# Patient Record
Sex: Male | Born: 1950 | Race: White | Hispanic: No | Marital: Single | Smoking: Current every day smoker
Health system: Southern US, Community
[De-identification: ages and names within clinical notes are randomized; demographics above are authoritative.]

## PROBLEM LIST (undated history)

## (undated) DIAGNOSIS — E039 Hypothyroidism, unspecified: Secondary | ICD-10-CM

## (undated) DIAGNOSIS — M549 Dorsalgia, unspecified: Secondary | ICD-10-CM

## (undated) DIAGNOSIS — G4730 Sleep apnea, unspecified: Secondary | ICD-10-CM

## (undated) DIAGNOSIS — G8929 Other chronic pain: Secondary | ICD-10-CM

## (undated) DIAGNOSIS — M109 Gout, unspecified: Secondary | ICD-10-CM

## (undated) HISTORY — PX: HERNIA REPAIR: SHX51

## (undated) HISTORY — PX: TONSILLECTOMY: SUR1361

---

## 2004-07-24 ENCOUNTER — Emergency Department: Payer: Self-pay | Admitting: Emergency Medicine

## 2005-01-14 ENCOUNTER — Ambulatory Visit: Payer: Self-pay | Admitting: Family Medicine

## 2005-03-31 ENCOUNTER — Ambulatory Visit: Payer: Self-pay | Admitting: Family Medicine

## 2006-10-26 ENCOUNTER — Ambulatory Visit: Payer: Self-pay | Admitting: General Practice

## 2007-02-27 ENCOUNTER — Emergency Department: Payer: Self-pay | Admitting: Emergency Medicine

## 2008-05-30 ENCOUNTER — Ambulatory Visit: Payer: Self-pay | Admitting: Nurse Practitioner

## 2008-08-25 ENCOUNTER — Ambulatory Visit: Payer: Self-pay | Admitting: Internal Medicine

## 2011-03-17 ENCOUNTER — Ambulatory Visit: Payer: Self-pay | Admitting: Internal Medicine

## 2011-08-19 ENCOUNTER — Ambulatory Visit: Payer: Self-pay | Admitting: Emergency Medicine

## 2011-08-19 LAB — BASIC METABOLIC PANEL
BUN: 14 mg/dL (ref 7–18)
Calcium, Total: 9.2 mg/dL (ref 8.5–10.1)
Chloride: 103 mmol/L (ref 98–107)
Co2: 30 mmol/L (ref 21–32)
Creatinine: 0.96 mg/dL (ref 0.60–1.30)
EGFR (African American): >60
Glucose: 117 mg/dL — ABNORMAL HIGH (ref 65–99)
Osmolality: 281 (ref 275–301)
Potassium: 4.2 mmol/L (ref 3.5–5.1)
Sodium: 140 mmol/L (ref 136–145)

## 2011-08-19 LAB — CBC WITH DIFFERENTIAL/PLATELET
Basophil %: 0.6 %
Eosinophil #: 0.5 10*3/uL (ref 0.0–0.7)
Eosinophil %: 5.8 %
HCT: 50.7 % (ref 40.0–52.0)
HGB: 17 g/dL (ref 13.0–18.0)
Lymphocyte #: 1.9 10*3/uL (ref 1.0–3.6)
MCH: 31.8 pg (ref 26.0–34.0)
MCHC: 33.6 g/dL (ref 32.0–36.0)
Monocyte #: 0.7 x10 3/mm (ref 0.2–1.0)
Platelet: 294 10*3/uL (ref 150–440)
RBC: 5.36 10*6/uL (ref 4.40–5.90)
RDW: 14.3 % (ref 11.5–14.5)

## 2011-08-26 ENCOUNTER — Ambulatory Visit: Payer: Self-pay | Admitting: Emergency Medicine

## 2014-07-02 NOTE — Op Note (Signed)
PATIENT NAME:  Jacob Parsons, Jacob Parsons MR#:  [id number] DATE OF BIRTH:  [birth year]  DATE OF PROCEDURE:  08/26/2011  PREOPERATIVE DIAGNOSIS: Incarcerated ventral hernia.   POSTOPERATIVE DIAGNOSIS: Incarcerated ventral hernia.   PROCEDURES:   1. Repair of incarcerated ventral hernia.  2. Insertion of mesh.   SURGEON: Heba Ige S. Shaden Higley, MD  INDICATION: This patient who is very obese, also he has bad chronic obstructive pulmonary disease. He has been coughing a lot and he also is a smoker, has a hernia which is for a while, getting worse and he has been getting obstructed. Last time when I saw in the office it was incarcerated. Reduced it and there was still red skin on top of it so advised him for surgery.   DESCRIPTION OF PROCEDURE: Patient was then brought to surgery although a bad surgical risk. He was put to sleep. Small incision was made transversely under the hernia. After cutting skin and subcutaneous tissue, the fascia was then reached. The hernia was dissected all around. Fascia was then picked up with the Allis clamps. The hernia sac was dissected under the fascia all around quite deep until all the adhesions were broken down the mesh was put in underneath and then circular mesh was then put in and was sutured with 0 Surgilon sutures interruptedly. Subcuticular closure with 3-0 Vicryl and 4-0 Vicryl was done and Steri-Strips were applied. Patient tolerated procedure well. Sent to recovery room in satisfactory condition.   ____________________________ Alton RevereMasud S. Cecelia ByarsHashmi, MD msh:cms D: 08/26/2011 13:01:28 ET T: 08/26/2011 14:11:00 ET JOB#: [id number]  cc: Aryahna Spagna S. Cecelia ByarsHashmi, MD, <Dictator> Serita ShellerErnest B. Maryellen PileEason, MD Meryle ReadyMASUD S Daking Westervelt MD ELECTRONICALLY SIGNED 08/26/2011 15:40

## 2014-08-08 ENCOUNTER — Emergency Department: Payer: Medicare HMO

## 2014-08-08 ENCOUNTER — Emergency Department
Admission: EM | Admit: 2014-08-08 | Discharge: 2014-08-08 | Disposition: A | Discharge status: Home / Self Care | Payer: Medicare HMO | Attending: Emergency Medicine | Admitting: Emergency Medicine

## 2014-08-08 DIAGNOSIS — Z79899 Other long term (current) drug therapy: Secondary | ICD-10-CM | POA: Insufficient documentation

## 2014-08-08 DIAGNOSIS — Z720 Tobacco use: Secondary | ICD-10-CM | POA: Insufficient documentation

## 2014-08-08 DIAGNOSIS — M10072 Idiopathic gout, left ankle and foot: Secondary | ICD-10-CM | POA: Diagnosis not present

## 2014-08-08 DIAGNOSIS — R2242 Localized swelling, mass and lump, left lower limb: Secondary | ICD-10-CM | POA: Diagnosis present

## 2014-08-08 HISTORY — DX: Gout, unspecified: M10.9

## 2014-08-08 HISTORY — DX: Dorsalgia, unspecified: M54.9

## 2014-08-08 HISTORY — DX: Other chronic pain: G89.29

## 2014-08-08 HISTORY — DX: Sleep apnea, unspecified: G47.30

## 2014-08-08 HISTORY — DX: Hypothyroidism, unspecified: E03.9

## 2014-08-08 MED ORDER — KETOROLAC TROMETHAMINE 30 MG/ML IJ SOLN: 60.0000 mg | Freq: Once | INTRAMUSCULAR | Status: AC

## 2014-08-08 MED ORDER — KETOROLAC TROMETHAMINE 60 MG/2ML IM SOLN
INTRAMUSCULAR | Status: AC
  Administered 2014-08-08: 60 mg
  Filled 2014-08-08: qty 2

## 2014-08-08 MED ORDER — HYDROCODONE-ACETAMINOPHEN 5-325 MG PO TABS
2.0000 | ORAL_TABLET | Freq: Once | ORAL | Status: AC
  Administered 2014-08-08: 2 via ORAL

## 2014-08-08 MED ORDER — HYDROCODONE-ACETAMINOPHEN 5-325 MG PO TABS: 1.0000 | ORAL_TABLET | ORAL | Status: AC | PRN

## 2014-08-08 MED ORDER — IBUPROFEN 800 MG PO TABS: 800.0000 mg | ORAL_TABLET | Freq: Three times a day (TID) | ORAL | Status: AC | PRN

## 2014-08-08 MED ORDER — HYDROCODONE-ACETAMINOPHEN 5-325 MG PO TABS
ORAL_TABLET | ORAL | Status: AC
  Administered 2014-08-08: 2 via ORAL
  Filled 2014-08-08: qty 2

## 2014-08-08 MED ORDER — COLCHICINE 0.6 MG PO TABS: 1.2000 mg | ORAL_TABLET | Freq: Once | ORAL | Status: AC

## 2014-08-08 NOTE — Discharge Instructions (Signed)

## 2014-08-08 NOTE — ED Provider Notes (Signed)
[facility] Emergency Department Provider Note  ____________________________________________  Time seen: Approximately 2:18 PM  I have reviewed the triage vital signs and the nursing notes.   HISTORY  Chief Complaint Leg Swelling    HPI Jacob Parsons is a 64 y.o. male presents with complaints of left foot swelling and pain 4 days. Patient states history of the same with history of gout. Does not have any medications.   Past Medical History  Diagnosis Date  . Gout   . Sleep apnea   . Hypothyroid   . Chronic back pain     There are no active problems to display for this patient.   Past Surgical History  Procedure Laterality Date  . Tonsillectomy    . Hernia repair      Current Outpatient Rx  Name  Route  Sig  Dispense  Refill  . colchicine 0.6 MG tablet   Oral   Take 2 tablets (1.2 mg total) by mouth once. Wait 1 hour and take the 3rd tablet   3 tablet   0   . HYDROcodone-acetaminophen (NORCO) 5-325 MG per tablet   Oral   Take 1-2 tablets by mouth every 4 (four) hours as needed for moderate pain.   15 tablet   0   . ibuprofen (ADVIL,MOTRIN) 800 MG tablet   Oral   Take 1 tablet (800 mg total) by mouth every 8 (eight) hours as needed.   30 tablet   0     Allergies Shellfish allergy  No family history on file.  Social History History  Substance Use Topics  . Smoking status: Current Every Day Smoker -- 1.00 packs/day    Types: Cigarettes  . Smokeless tobacco: Never Used  . Alcohol Use: Yes    Review of Systems Constitutional: No fever/chills Eyes: No visual changes. ENT: No sore throat. Cardiovascular: Denies chest pain. Respiratory: Denies shortness of breath. Gastrointestinal: No abdominal pain.  No nausea, no vomiting.  No diarrhea.  No constipation. Genitourinary: Negative for dysuria. Musculoskeletal: Negative for back pain. Positive for left foot and leg pain. Skin: Negative for rash. Neurological: Negative  for headaches, focal weakness or numbness.  10-point ROS otherwise negative.  ____________________________________________   PHYSICAL EXAM:  VITAL SIGNS: ED Triage Vitals  Enc Vitals Group     BP 08/08/14 1243 142/92 mmHg     Pulse Rate 08/08/14 1243 95     Resp 08/08/14 1243 18     Temp 08/08/14 1243 98 F (36.7 C)     Temp Source 08/08/14 1243 Oral     SpO2 08/08/14 1243 89 %     Weight 08/08/14 1243 330 lb (149.687 kg)     Height 08/08/14 1243  (1.727 m)     Head Cir --      Peak Flow --      Pain Score 08/08/14 1256 10     Pain Loc --      Pain Edu? --      Excl. in GC? --     Constitutional: Alert and oriented. Well appearing and in no acute distress. Eyes: Conjunctivae are normal. PERRL. EOMI. Head: Atraumatic. Nose: No congestion/rhinnorhea. Mouth/Throat: Mucous membranes are moist.  Oropharynx non-erythematous. Neck: No stridor.   Cardiovascular: Normal rate, regular rhythm. Grossly normal heart sounds.  Good peripheral circulation. Respiratory: Normal respiratory effort.  No retractions. Lungs CTAB. Gastrointestinal: Soft and nontender. No distention. No abdominal bruits. No CVA tenderness. Musculoskeletal: No lower extremity tenderness nor edema.  No joint effusions. Neurologic:  Normal speech and language. No gross focal neurologic deficits are appreciated. Speech is normal. No gait instability. Skin:  Skin is warm, dry and intact. No rash noted. Psychiatric: Mood and affect are normal. Speech and behavior are normal.  ____________________________________________   LABS (all labs ordered are listed, but only abnormal results are displayed)  Labs Reviewed - No data to display ____________________________________________  EKG  None  ____________________________________________  RADIOLOGY  Negative ____________________________________________   PROCEDURES  Procedure(s) performed: None  Critical Care performed:  No  ____________________________________________   INITIAL IMPRESSION / ASSESSMENT AND PLAN / ED COURSE  Pertinent labs & imaging results that were available during my care of the patient were reviewed by me and considered in my medical decision making (see chart for details).  Patient was treated for gout. Rx given for colchicine and hydrocodone ibuprofen. He will follow up as needed. No other emergency medical complaints at this time. ____________________________________________   FINAL CLINICAL IMPRESSION(S) / ED DIAGNOSES  Final diagnoses:  Acute idiopathic gout of left foot      Evangeline DakinCharles M Jayan Raymundo, PA-C 08/08/14 1631  Jene Everyobert Kinner, MD 08/12/14 1539

## 2014-08-08 NOTE — ED Notes (Signed)
Pt c/o pain and swelling to the left foot since Thursday, states he has a hx of gout and this is the same..Marland Kitchen

## 2014-08-14 ENCOUNTER — Telehealth: Payer: Self-pay | Admitting: Family Medicine

## 2015-06-09 DEATH — deceased

## 2015-10-12 NOTE — Telephone Encounter (Signed)
error 

## 2016-08-11 IMAGING — CR DG FOOT COMPLETE 3+V*L*
1 series · 3 of 3 positions shown · non-contrast
Comparison: 02/27/2007

CLINICAL DATA: LEFT foot pain and swelling, history gout

EXAM:
LEFT FOOT - COMPLETE 3+ VIEW

[Series 1: x foot ap left · 0.14mm/px · 3 of 3 slices shown]
[im 1/3]
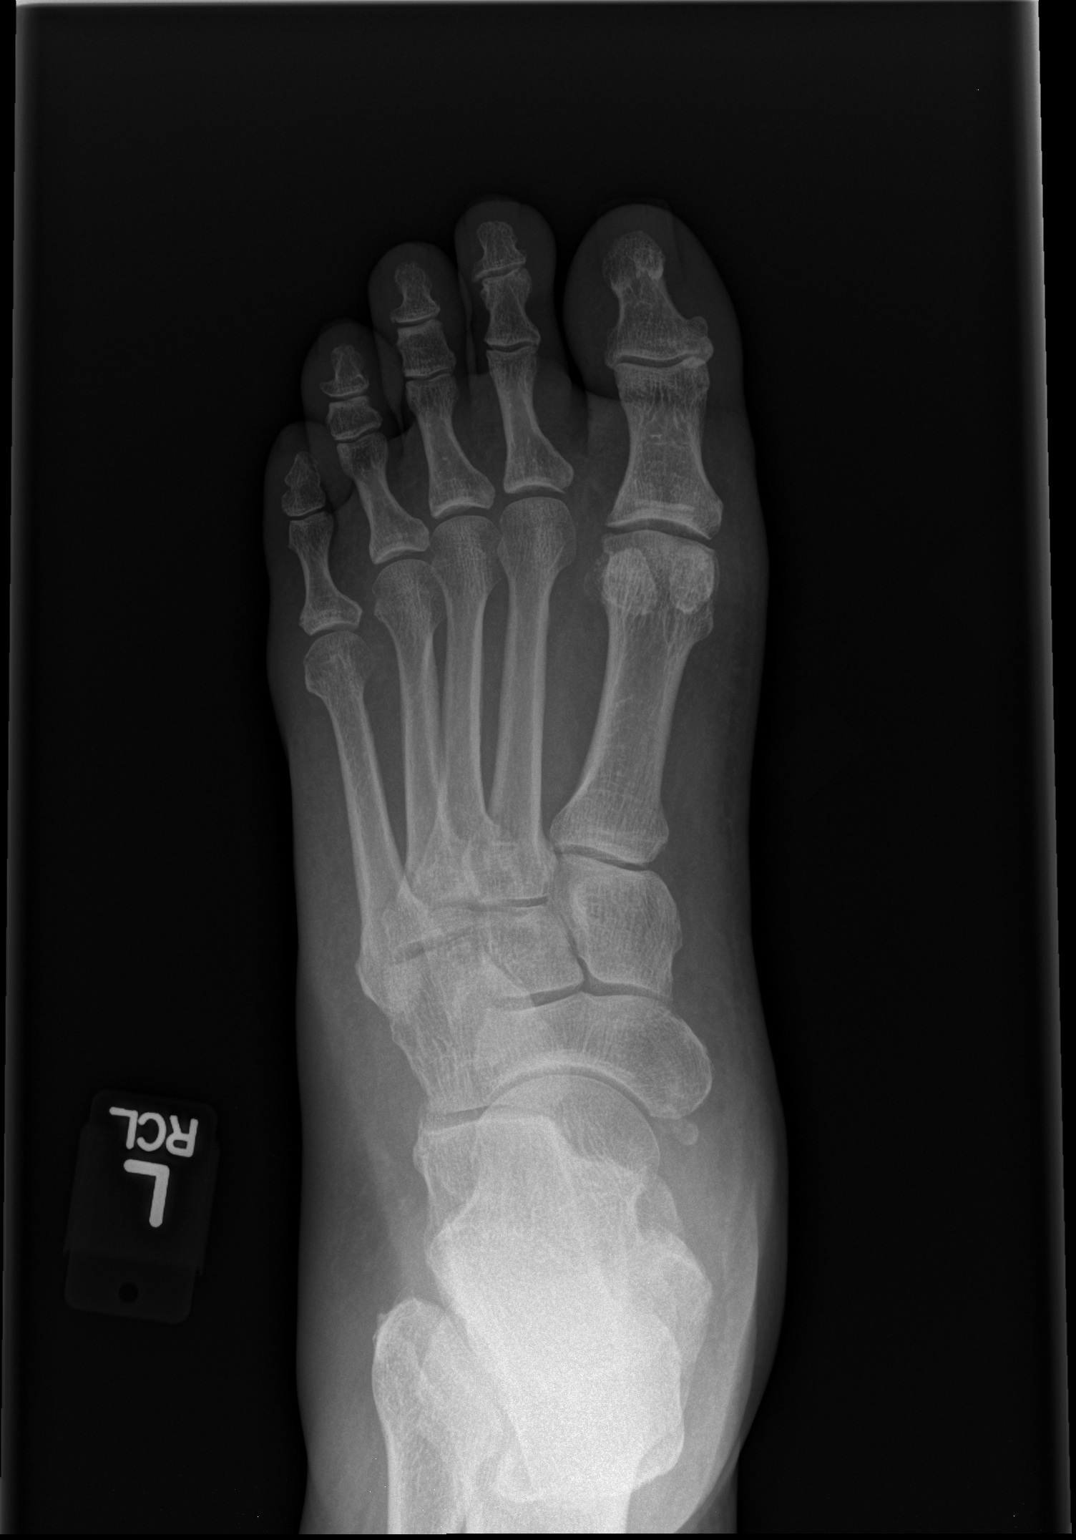
[im 2/3]
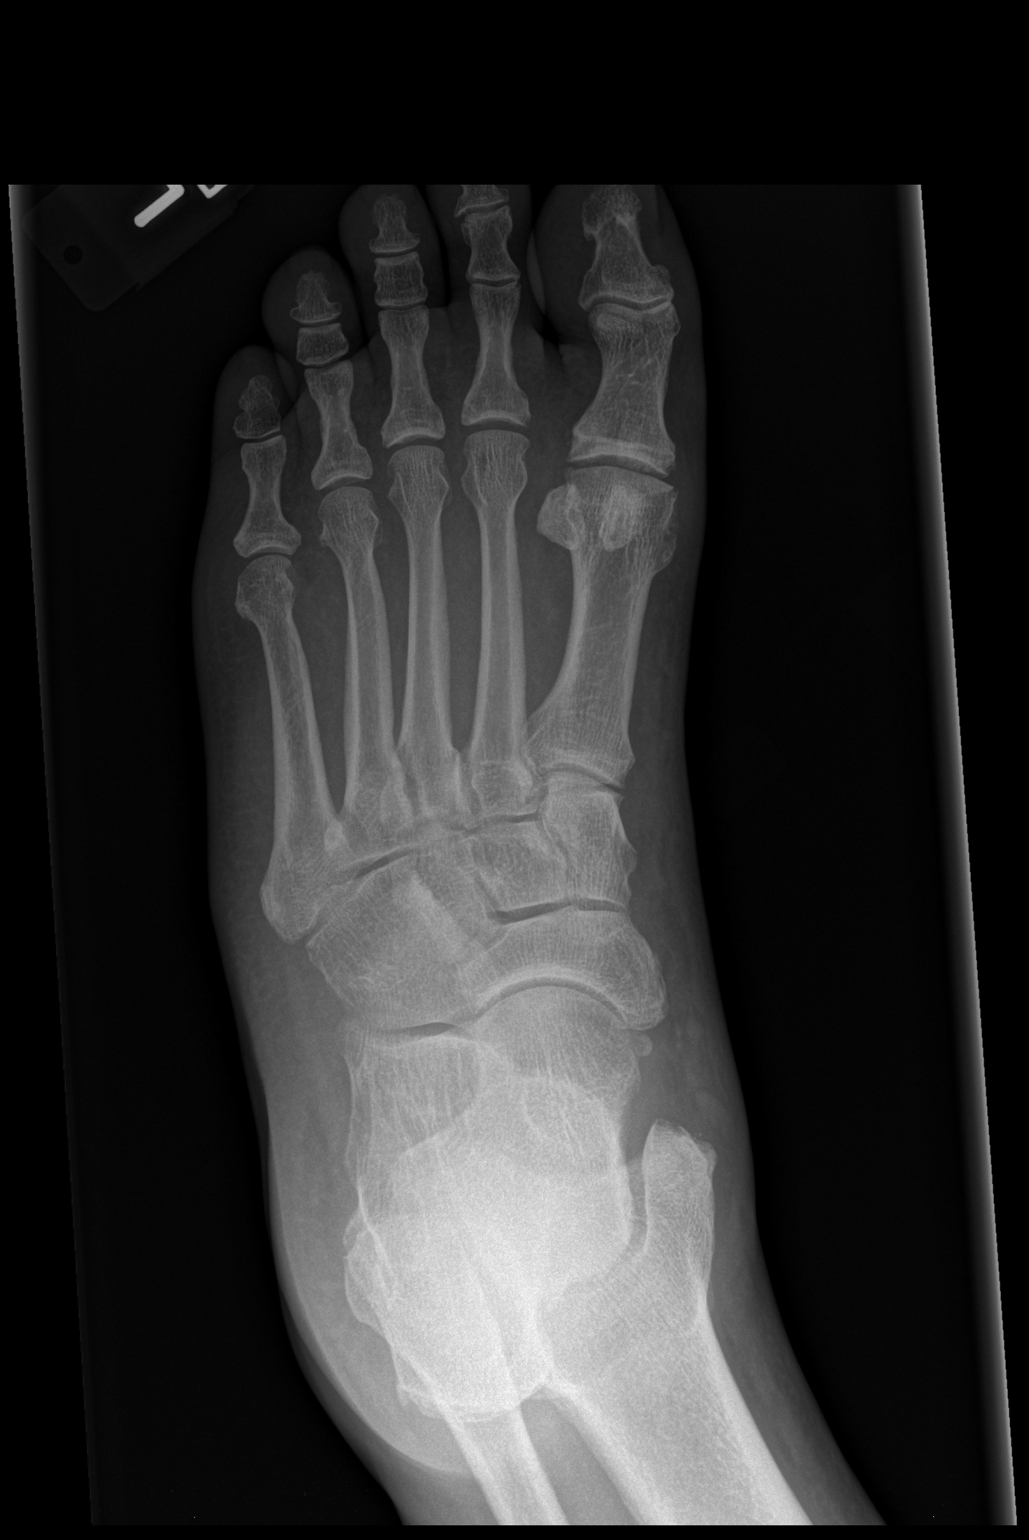
[im 3/3]
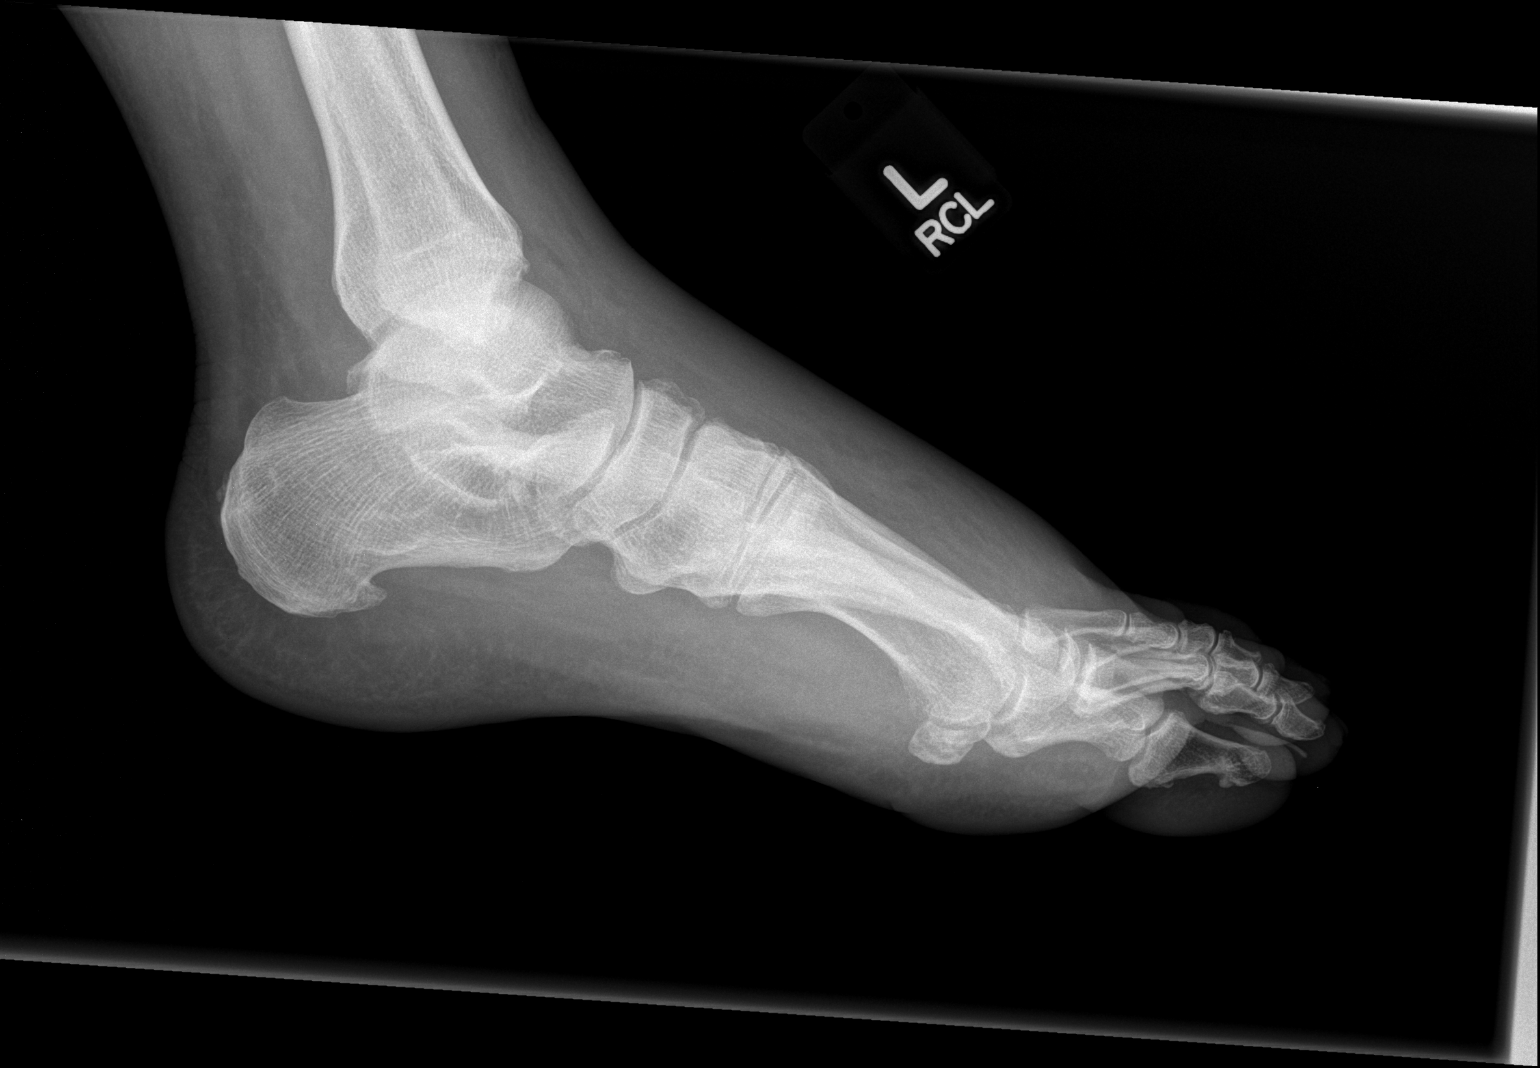

[3 of 3 positions shown; findings below may reference images not displayed]

FINDINGS: Osseous mineralization grossly normal for technique.

Joint spaces preserved.

No acute fracture, dislocation or bone destruction.

Diffuse soft tissue swelling greatest at dorsum of foot.

Plantar calcaneal spur.
IMPRESSION: Significant soft tissue swelling without acute bony findings.
